# Patient Record
Sex: Male | Born: 1970 | Race: Black or African American | Hispanic: No | Smoking: Current every day smoker
Health system: Southern US, Community
[De-identification: ages and names within clinical notes are randomized; demographics above are authoritative.]

## PROBLEM LIST (undated history)

## (undated) DIAGNOSIS — I10 Essential (primary) hypertension: Secondary | ICD-10-CM

---

## 1997-04-26 DIAGNOSIS — I10 Essential (primary) hypertension: Secondary | ICD-10-CM

## 2004-08-05 ENCOUNTER — Ambulatory Visit: Payer: Self-pay | Admitting: Family Medicine

## 2004-08-12 ENCOUNTER — Ambulatory Visit: Payer: Self-pay | Admitting: *Deleted

## 2004-10-23 ENCOUNTER — Ambulatory Visit: Payer: Self-pay | Admitting: Family Medicine

## 2005-03-09 ENCOUNTER — Emergency Department (HOSPITAL_COMMUNITY): Admission: EM | Admit: 2005-03-09 | Discharge: 2005-03-09 | Payer: Self-pay | Admitting: Emergency Medicine

## 2008-01-19 ENCOUNTER — Ambulatory Visit: Payer: Self-pay | Admitting: Nurse Practitioner

## 2008-01-20 ENCOUNTER — Encounter (INDEPENDENT_AMBULATORY_CARE_PROVIDER_SITE_OTHER): Payer: Self-pay | Admitting: Nurse Practitioner

## 2008-01-20 LAB — CONVERTED CEMR LAB
AST: 30 units/L (ref 0–37)
Alkaline Phosphatase: 64 units/L (ref 39–117)
BUN: 23 mg/dL (ref 6–23)
CO2: 27 meq/L (ref 19–32)
Cholesterol: 203 mg/dL — ABNORMAL HIGH (ref 0–200)
Eosinophils Absolute: 0.1 10*3/uL (ref 0.0–0.7)
Eosinophils Relative: 2 % (ref 0–5)
Glucose, Bld: 90 mg/dL (ref 70–99)
HCT: 46.3 % (ref 39.0–52.0)
HDL: 72 mg/dL (ref >39)
Hemoglobin: 14.7 g/dL (ref 13.0–17.0)
LDL Cholesterol: 115 mg/dL — ABNORMAL HIGH (ref 0–99)
Lymphocytes Relative: 26 % (ref 12–46)
Lymphs Abs: 1.7 10*3/uL (ref 0.7–4.0)
MCV: 81.1 fL (ref 78.0–100.0)
Microalb, Ur: 1.8 mg/dL (ref 0.00–1.89)
Monocytes Absolute: 0.4 10*3/uL (ref 0.1–1.0)
Monocytes Relative: 7 % (ref 3–12)
Platelets: 214 10*3/uL (ref 150–400)
RBC: 5.71 M/uL (ref 4.22–5.81)
Sodium: 142 meq/L (ref 135–145)
Total Bilirubin: 0.4 mg/dL (ref 0.3–1.2)
Total Protein: 7.4 g/dL (ref 6.0–8.3)
Triglycerides: 78 mg/dL (ref <150)
VLDL: 16 mg/dL (ref 0–40)
WBC: 6.6 10*3/uL (ref 4.0–10.5)

## 2008-01-22 ENCOUNTER — Encounter (INDEPENDENT_AMBULATORY_CARE_PROVIDER_SITE_OTHER): Payer: Self-pay | Admitting: Nurse Practitioner

## 2008-01-22 ENCOUNTER — Ambulatory Visit: Payer: Self-pay | Admitting: *Deleted

## 2008-02-02 ENCOUNTER — Ambulatory Visit: Payer: Self-pay | Admitting: Nurse Practitioner

## 2010-05-24 LAB — CONVERTED CEMR LAB
Bilirubin Urine: NEGATIVE
Blood in Urine, dipstick: NEGATIVE
Glucose, Urine, Semiquant: NEGATIVE
Ketones, urine, test strip: NEGATIVE
Protein, U semiquant: NEGATIVE
Urobilinogen, UA: 0.2
pH: 7

## 2015-04-07 ENCOUNTER — Encounter (HOSPITAL_COMMUNITY): Payer: Self-pay | Admitting: Family Medicine

## 2015-04-07 ENCOUNTER — Emergency Department (HOSPITAL_COMMUNITY)
Admission: EM | Admit: 2015-04-07 | Discharge: 2015-04-07 | Disposition: A | Discharge status: Home / Self Care | Payer: Self-pay | Attending: Emergency Medicine | Admitting: Emergency Medicine

## 2015-04-07 ENCOUNTER — Emergency Department (HOSPITAL_COMMUNITY): Payer: Self-pay

## 2015-04-07 DIAGNOSIS — Z79899 Other long term (current) drug therapy: Secondary | ICD-10-CM | POA: Insufficient documentation

## 2015-04-07 DIAGNOSIS — I10 Essential (primary) hypertension: Secondary | ICD-10-CM | POA: Insufficient documentation

## 2015-04-07 DIAGNOSIS — R51 Headache: Secondary | ICD-10-CM | POA: Insufficient documentation

## 2015-04-07 DIAGNOSIS — F17210 Nicotine dependence, cigarettes, uncomplicated: Secondary | ICD-10-CM | POA: Insufficient documentation

## 2015-04-07 DIAGNOSIS — Q613 Polycystic kidney, unspecified: Secondary | ICD-10-CM | POA: Insufficient documentation

## 2015-04-07 DIAGNOSIS — R6883 Chills (without fever): Secondary | ICD-10-CM | POA: Insufficient documentation

## 2015-04-07 HISTORY — DX: Essential (primary) hypertension: I10

## 2015-04-07 LAB — COMPREHENSIVE METABOLIC PANEL
ALT: 15 U/L — ABNORMAL LOW (ref 17–63)
ANION GAP: 10 (ref 5–15)
AST: 21 U/L (ref 15–41)
Albumin: 4.5 g/dL (ref 3.5–5.0)
Alkaline Phosphatase: 67 U/L (ref 38–126)
BUN: 14 mg/dL (ref 6–20)
CHLORIDE: 107 mmol/L (ref 101–111)
CO2: 22 mmol/L (ref 22–32)
Calcium: 9.6 mg/dL (ref 8.9–10.3)
Creatinine, Ser: 1.55 mg/dL — ABNORMAL HIGH (ref 0.61–1.24)
GFR, EST NON AFRICAN AMERICAN: 53 mL/min — AB (ref >60)
Glucose, Bld: 111 mg/dL — ABNORMAL HIGH (ref 65–99)
POTASSIUM: 4.1 mmol/L (ref 3.5–5.1)
Sodium: 139 mmol/L (ref 135–145)
TOTAL PROTEIN: 7.1 g/dL (ref 6.5–8.1)
Total Bilirubin: 0.5 mg/dL (ref 0.3–1.2)

## 2015-04-07 LAB — URINALYSIS, ROUTINE W REFLEX MICROSCOPIC
Bilirubin Urine: NEGATIVE
Glucose, UA: NEGATIVE mg/dL
Hgb urine dipstick: NEGATIVE
Ketones, ur: NEGATIVE mg/dL
LEUKOCYTES UA: NEGATIVE
NITRITE: NEGATIVE
Protein, ur: NEGATIVE mg/dL
SPECIFIC GRAVITY, URINE: 1.015 (ref 1.005–1.030)
pH: 6.5 (ref 5.0–8.0)

## 2015-04-07 LAB — CBC WITH DIFFERENTIAL/PLATELET
BASOS ABS: 0 10*3/uL (ref 0.0–0.1)
Basophils Relative: 0 %
EOS ABS: 0.5 10*3/uL (ref 0.0–0.7)
Eosinophils Relative: 6 %
HCT: 44.7 % (ref 39.0–52.0)
HEMOGLOBIN: 14.7 g/dL (ref 13.0–17.0)
LYMPHS ABS: 1.2 10*3/uL (ref 0.7–4.0)
LYMPHS PCT: 14 %
MCH: 27.1 pg (ref 26.0–34.0)
MCHC: 32.9 g/dL (ref 30.0–36.0)
MCV: 82.3 fL (ref 78.0–100.0)
Monocytes Absolute: 0.4 10*3/uL (ref 0.1–1.0)
Monocytes Relative: 5 %
NEUTROS PCT: 75 %
Neutro Abs: 6.2 10*3/uL (ref 1.7–7.7)
Platelets: 215 10*3/uL (ref 150–400)
RBC: 5.43 MIL/uL (ref 4.22–5.81)
RDW: 13.9 % (ref 11.5–15.5)
WBC: 8.4 10*3/uL (ref 4.0–10.5)

## 2015-04-07 MED ORDER — MORPHINE SULFATE (PF) 4 MG/ML IV SOLN
4.0000 mg | Freq: Once | INTRAVENOUS | Status: AC
  Administered 2015-04-07: 4 mg via INTRAVENOUS
  Filled 2015-04-07: qty 1

## 2015-04-07 MED ORDER — LISINOPRIL 20 MG PO TABS
20.0000 mg | ORAL_TABLET | Freq: Once | ORAL | Status: AC
  Administered 2015-04-07: 20 mg via ORAL
  Filled 2015-04-07: qty 1

## 2015-04-07 MED ORDER — HYDROCHLOROTHIAZIDE 25 MG PO TABS
25.0000 mg | ORAL_TABLET | Freq: Once | ORAL | Status: AC
  Administered 2015-04-07: 25 mg via ORAL
  Filled 2015-04-07: qty 1

## 2015-04-07 MED ORDER — HYDROCODONE-ACETAMINOPHEN 5-325 MG PO TABS: 1.0000 | ORAL_TABLET | Freq: Four times a day (QID) | ORAL | Status: DC | PRN

## 2015-04-07 MED ORDER — AMLODIPINE BESYLATE 5 MG PO TABS
10.0000 mg | ORAL_TABLET | Freq: Once | ORAL | Status: AC
  Administered 2015-04-07: 10 mg via ORAL
  Filled 2015-04-07: qty 2

## 2015-04-07 NOTE — ED Provider Notes (Signed)
CSN: [id number]     Arrival date & time 04/07/15  [id number] History   First MD Initiated Contact with Patient 04/07/15 [phone]     Chief Complaint  Patient presents with  . Back Pain    HPI  Joseph Randall is a 44 year old with PMH of HTN who presents with left sided back and flank pain. The pain woke him from sleep Saturday morning, described as a throbbing sensation that worsens when he tries to sit down or stand up. He has associated headache and chills. He was concerned about a kidney problem and has been drinking more fluids during this time, which has provided some relief. He has also used [employer] which have helped some with his pain and headache. He denies any fever, N/V/D/C, dysuria, numbness/tingling, diaphoresis, chest pain, or difficulty breathing. He denies any history of pyelonephritis or renal stones. He smokes about a 0.5 PPD and reports alcohol use on the weekends. He denies any recreational drug use.  Past Medical History  Diagnosis Date  . Hypertension    History reviewed. No pertinent past surgical history. History reviewed. No pertinent family history. Social History  Substance Use Topics  . Smoking status: Current Every Day Smoker -- 0.50 packs/day    Types: Cigarettes  . Smokeless tobacco: None  . Alcohol Use: Yes     Comment: occ    Review of Systems  Constitutional: Positive for chills. Negative for fever, diaphoresis and fatigue.  Respiratory: Negative for shortness of breath.   Cardiovascular: Negative for chest pain and palpitations.  Gastrointestinal: Negative for nausea, vomiting, diarrhea, constipation and blood in stool.  Genitourinary: Positive for frequency and flank pain. Negative for dysuria and hematuria.  Musculoskeletal: Positive for back pain.  Neurological: Positive for headaches. Negative for numbness.      Allergies  Review of patient's allergies indicates no known allergies.  Home Medications   Prior to Admission medications    Medication Sig Start Date End Date Taking? Authorizing Provider  amLODipine (NORVASC) 10 MG tablet Take 10 mg by mouth daily.   Yes Historical Provider, MD  lisinopril-hydrochlorothiazide (PRINZIDE,ZESTORETIC) 20-25 MG tablet Take 1 tablet by mouth daily.   Yes Historical Provider, MD  HYDROcodone-acetaminophen (NORCO/VICODIN) 5-325 MG tablet Take 1 tablet by mouth every 6 (six) hours as needed for moderate pain. 04/07/15   Darreld McleanVishal Patel, MD   BP 187/121 mmHg  Pulse 72  Temp(Src) 98.5 F (36.9 C) (Oral)  Resp 16  SpO2 100% Physical Exam  Constitutional: He is oriented to person, place, and time. He appears well-developed and well-nourished. No distress.  HENT:  Head: Normocephalic and atraumatic.  Cardiovascular: Normal rate and regular rhythm.   No murmur heard. Pulmonary/Chest: Effort normal. No respiratory distress. He has no wheezes. He has no rales.  Abdominal: Soft. Bowel sounds are normal. There is no tenderness. There is no guarding.  There is Left sided CVA tenderness  Musculoskeletal:  Pain with flexion of hips bilaterally, more pronounced on left, decreased ROM due to pain.  Neurological: He is alert and oriented to person, place, and time.    ED Course  Procedures (including critical care time) Labs Review Labs Reviewed  COMPREHENSIVE METABOLIC PANEL - Abnormal; Notable for the following:    Glucose, Bld 111 (*)    Creatinine, Ser 1.55 (*)    ALT 15 (*)    GFR calc non Af Amer 53 (*)    All other components within normal limits  CBC WITH DIFFERENTIAL/PLATELET  URINALYSIS,  ROUTINE W REFLEX MICROSCOPIC (NOT AT [facility])    Imaging Review Ct Renal Stone Study  04/07/2015  CLINICAL DATA:  Left flank pain. EXAM: CT ABDOMEN AND PELVIS WITHOUT CONTRAST TECHNIQUE: Multidetector CT imaging of the abdomen and pelvis was performed following the standard protocol without IV contrast. COMPARISON:  None. FINDINGS: Lower chest: Lung bases are clear without infiltrate or effusion.  Heart size normal. Hepatobiliary: Multiple hepatic cysts are present measuring under 2 cm in diameter. No mass lesion. Gallbladder and bile ducts normal. Pancreas: Negative Spleen: Negative Adrenals/Urinary Tract: Numerous renal cysts bilaterally. The kidneys are enlarged. There are nonobstructing small stones in the left lower pole. No renal obstruction. No ureteral or bladder calculus. Urinary bladder normal. Stomach/Bowel: Negative for bowel obstruction or bowel edema. Normal appendix. Vascular/Lymphatic: Normal aorta and IVC. Negative for lymphadenopathy. Reproductive: Prostate mildly enlarged. Other: Negative for ascites. Musculoskeletal: No significant skeletal abnormality. IMPRESSION: Extensive cystic change throughout both kidneys. The kidneys are enlarged. There are hepatic cysts. Findings are consistent with polycystic kidney disease. Nonobstructing small left lower pole renal calculi. No hydronephrosis and no ureteral stone. Electronically Signed   By: Marlan Palau M.D.   On: 04/07/2015 11:04   I have personally reviewed and evaluated these images and lab results as part of my medical decision-making.   EKG Interpretation None      MDM   Final diagnoses:  Polycystic kidney disease  Joseph Randall is a 44 year old with PMH of HTN who presents with 2 day history of left sided back and flank pain. Pain is limiting his mobility and has left sided CVA tenderness on percussion. He is afebrile with normal heart rate, but blood pressure elevated at 203/134 on arrival. Initial concerns are for renal stones or pyelonephritis.  Will get CT Renal Stone Study to evaluate as well as CBC, CMP, and UA.  CBC and UA return normal, CMP shows elevated creatinine at 1.55. CT imaging reveals bilaterally enlarged kidneys with cystic changes throughout. Multiple hepatic cysts are also seen. Findings consistent with polycystic kidney disease. Patient is unaware of any renal disease in immediate family members.  A non-obstructing small left lower pole renal calculi is also seen on imaging, without hydronephrosis or ureteral stone.  Patient given home doses of BP meds, Lisinopril, Amlodipine and HCTZ prior to discharge.  Patient advised to follow up with his PCP, Dr. Harvest Dark for tight blood pressure control and further management. He is given short term Vicodin for pain management and advised to use ice/heat pads as well as continued fluid hydration.     Darreld Mclean, MD 04/07/15 1202  Arby Barrette, MD 04/08/15 [phone]

## 2015-04-07 NOTE — Discharge Instructions (Signed)
Please take your blood pressure medications as prescribed and follow up with your regular doctor in the next 1-2 weeks. You can try ice/heat packs for pain in addition to the pain medication we are prescribing. Continue to stay hydrated with fluid intake. Imaging showed that you have large kidneys and likely a condition called Polycystic Kidney Disease.   Polycystic Kidney Disease, Adult Polycystic kidney disease is a disease in which the kidneys grow many small fluid-filled cysts. These cysts squeeze healthy kidney tissue, hurting the function of the kidneys. Polycystic kidney disease is present at birth and often causes progressive loss of kidney function and high blood pressure (hypertension). In milder forms of the disease, kidney function may be adequate throughout life.  CAUSES  The condition is usually inherited from parents. SIGNS AND SYMPTOMS  Hypertension.   Not enough healthy red blood cells to carry adequate oxygen to your tissues (anemia).   Pain in middle and side of the back below ribs (flank pain). This can happen if a cyst is bleeding.   Blood in the urine.   Kidney failure.   Kidney stones.   Increased urination at night.   Liver disease and liver cysts.  DIAGNOSIS  Your health care provider will ask you about your history and symptoms and perform a physical exam. Tests may be done to diagnose the condition. They may include an ultrasound, a CT scan, or an MRI scan. Sometimes chromosome studies are done to see if you have the genes to have polycystic kidneys.  TREATMENT   There is no treatment at this time to keep cysts from forming or getting larger.  Cysts may need to be drained with a needle if they are large, putting pressure on other organs, and further destroying kidney tissue. This may require surgery.  Hypertension may be treated with medicines. Treating hypertension slows down further damage to the kidneys.  If kidney failure occurs, dialysis or  transplantation is used to treat the disease. HOME CARE INSTRUCTIONS You should follow up with a kidney specialist (nephrologist) so that your kidney function is monitored. SEEK MEDICAL CARE IF:  You have severe pain in the flank area.  You have blood in your urine.   This information is not intended to replace advice given to you by your health care provider. Make sure you discuss any questions you have with your health care provider.   Document Released: 01/05/2001 Document Revised: 08/27/2014 Document Reviewed: 09/14/2012 Elsevier Interactive Patient Education [employer]2016 Elsevier Inc.

## 2015-04-07 NOTE — ED Notes (Signed)
Pt presents from home via POV with c/o left lower back pain x2 days.  Denies hx renal issues, but is concerned for kidney stone/infection today.  Also reports does a lot of heavy lifting at work.  He says he has been drinking a lot of water over the last two days and that has helped his pain. Pt ambulatory to treatment room, in NAD.

## 2015-04-07 NOTE — ED Notes (Addendum)
Pt comfortable with discharge and follow up instructions. Pt declines wheelchair, escorted to waiting area by this RN. Prescriptions x1. 

## 2015-06-11 ENCOUNTER — Emergency Department (HOSPITAL_COMMUNITY)
Admission: EM | Admit: 2015-06-11 | Discharge: 2015-06-11 | Disposition: A | Discharge status: Home / Self Care | Payer: Self-pay | Attending: Emergency Medicine | Admitting: Emergency Medicine

## 2015-06-11 ENCOUNTER — Encounter (HOSPITAL_COMMUNITY): Payer: Self-pay | Admitting: Vascular Surgery

## 2015-06-11 DIAGNOSIS — M545 Low back pain, unspecified: Secondary | ICD-10-CM

## 2015-06-11 DIAGNOSIS — R509 Fever, unspecified: Secondary | ICD-10-CM | POA: Insufficient documentation

## 2015-06-11 DIAGNOSIS — I159 Secondary hypertension, unspecified: Secondary | ICD-10-CM | POA: Insufficient documentation

## 2015-06-11 DIAGNOSIS — I10 Essential (primary) hypertension: Secondary | ICD-10-CM | POA: Insufficient documentation

## 2015-06-11 DIAGNOSIS — F17210 Nicotine dependence, cigarettes, uncomplicated: Secondary | ICD-10-CM | POA: Insufficient documentation

## 2015-06-11 LAB — I-STAT CHEM 8, ED
BUN: 18 mg/dL (ref 6–20)
CHLORIDE: 101 mmol/L (ref 101–111)
Calcium, Ion: 1.16 mmol/L (ref 1.12–1.23)
Creatinine, Ser: 1.5 mg/dL — ABNORMAL HIGH (ref 0.61–1.24)
Glucose, Bld: 82 mg/dL (ref 65–99)
HEMATOCRIT: 50 % (ref 39.0–52.0)
Hemoglobin: 17 g/dL (ref 13.0–17.0)
POTASSIUM: 3.8 mmol/L (ref 3.5–5.1)
SODIUM: 142 mmol/L (ref 135–145)
TCO2: 27 mmol/L (ref 0–100)

## 2015-06-11 MED ORDER — HYDROCODONE-ACETAMINOPHEN 5-325 MG PO TABS
2.0000 | ORAL_TABLET | Freq: Once | ORAL | Status: AC
  Administered 2015-06-11: 2 via ORAL
  Filled 2015-06-11: qty 2

## 2015-06-11 MED ORDER — LISINOPRIL 10 MG PO TABS
10.0000 mg | ORAL_TABLET | Freq: Once | ORAL | Status: AC
  Administered 2015-06-11: 10 mg via ORAL
  Filled 2015-06-11: qty 1

## 2015-06-11 MED ORDER — AMLODIPINE BESYLATE 10 MG PO TABS: 10.0000 mg | ORAL_TABLET | Freq: Every day | ORAL | Status: AC

## 2015-06-11 MED ORDER — AMLODIPINE BESYLATE 5 MG PO TABS
10.0000 mg | ORAL_TABLET | Freq: Once | ORAL | Status: AC
  Administered 2015-06-11: 10 mg via ORAL
  Filled 2015-06-11: qty 2

## 2015-06-11 MED ORDER — LISINOPRIL-HYDROCHLOROTHIAZIDE 20-25 MG PO TABS: 1.0000 | ORAL_TABLET | Freq: Every day | ORAL | Status: AC

## 2015-06-11 MED ORDER — HYDROCODONE-ACETAMINOPHEN 5-325 MG PO TABS: 2.0000 | ORAL_TABLET | ORAL | Status: AC | PRN

## 2015-06-11 NOTE — ED Provider Notes (Signed)
CSN: [id number]     Arrival date & time 06/11/15  2006 History  By signing my name below, I, Linna Darner, attest that this documentation has been prepared under the direction and in the presence of non-physician practitioner, Haynes Dage, PA-C. Electronically Signed: Linna Darner, Scribe. 06/11/2015. 9:15 PM.   Chief Complaint  Patient presents with  . Back Pain    The history is provided by the patient. No language interpreter was used.    HPI Comments: Randie Tallarico is a 45 y.o. male with h/o HTN who presents to the Emergency Department complaining of gradual onset, intermittent, lower back pain and stiffness for a few years. He states that his pain has worsened significantly over the past few months. He works at [employer] and stands throughout his shift; he notes this could be the reason for his back pain. He endorses that his back pain is at its worst when he raises his feet or is in a "low position." He reports that he takes Advil for his back pain with no relief. Pt endorses pain with palpation to his lower left side. Pt also notes intermittent fever and chills as well; he states that they come and go with weather changes. Pt is out of his BP medication. He denies h/o cancer or recent prednisone use. He also denies lower extremity weakness/numbness, bowel/bladder incontinence or retention, or any other associated symptoms at this time.     Past Medical History  Diagnosis Date  . Hypertension    History reviewed. No pertinent past surgical history. No family history on file. Social History  Substance Use Topics  . Smoking status: Current Every Day Smoker -- 0.50 packs/day    Types: Cigarettes  . Smokeless tobacco: None  . Alcohol Use: Yes     Comment: occ    Review of Systems  Constitutional: Positive for fever (intermittent) and chills (intermittent).  Genitourinary:       No bladder/bowel incontinence or retention  Musculoskeletal: Positive for back pain  (lower).  Neurological: Negative for weakness and numbness.  All other systems reviewed and are negative.     Allergies  Review of patient's allergies indicates no known allergies.  Home Medications   Prior to Admission medications   Medication Sig Start Date End Date Taking? Authorizing Provider  amLODipine (NORVASC) 10 MG tablet Take 1 tablet (10 mg total) by mouth daily. 06/11/15   Gust Eugene Patel-Mills, PA-C  HYDROcodone-acetaminophen (NORCO/VICODIN) 5-325 MG tablet Take 2 tablets by mouth every 4 (four) hours as needed. 06/11/15   Darleen Moffitt Patel-Mills, PA-C  lisinopril-hydrochlorothiazide (PRINZIDE,ZESTORETIC) 20-25 MG tablet Take 1 tablet by mouth daily. 06/11/15   Lenea Bywater Patel-Mills, PA-C   BP 208/129 mmHg  Pulse 74  Temp(Src) 98.3 F (36.8 C) (Oral)  Resp 14  SpO2 99% Physical Exam  Constitutional: He is oriented to person, place, and time. He appears well-developed and well-nourished. No distress.  HENT:  Head: Normocephalic and atraumatic.  Eyes: Conjunctivae and EOM are normal.  Neck: Neck supple. No tracheal deviation present.  Cardiovascular: Normal rate, regular rhythm and normal heart sounds.   No murmur heard. Pulmonary/Chest: Effort normal and breath sounds normal. No respiratory distress. He has no wheezes. He has no rales.  Abdominal:  No abdominal tenderness to palpation.  Musculoskeletal: Normal range of motion.  Left flank tenderness with [employer] punch. No rash or ecchymosis. No midline lumbar vertebral tenderness or thoracic tenderness.  No lower extremity weakness or numbness. No saddle anesthesia. Ambulatory with steady gait.  Neurological:  He is alert and oriented to person, place, and time.  Skin: Skin is warm and dry.  Psychiatric: He has a normal mood and affect. His behavior is normal.  Nursing note and vitals reviewed.   ED Course  Procedures (including critical care time)  DIAGNOSTIC STUDIES: Oxygen Saturation is 98% on RA, normal by my  interpretation.    COORDINATION OF CARE: 9:16 PM Will administer Norvasc, Zestril, and Vicodin. Will order blood work. Discussed treatment plan with pt at bedside and pt agreed to plan.   Labs Review Labs Reviewed  I-STAT CHEM 8, ED - Abnormal; Notable for the following:    Creatinine, Ser 1.50 (*)    All other components within normal limits    Imaging Review No results found. I have personally reviewed and evaluated these lab results as part of my medical decision-making.   EKG Interpretation None      MDM   Final diagnoses:  Secondary hypertension, unspecified  Left-sided low back pain without sciatica   Patient presents for left-sided back pain. Reports having this for several years. Worse with movement of the opposite leg (right).  According to note on 04/07/2015 Patient advised to follow up with his PCP, Dr. Harvest Dark for tight blood pressure control and further management. He is given short term Vicodin for pain management and advised to use ice/heat pads as well as continued fluid hydration. Patient given home doses of BP meds, Lisinopril, Amlodipine and HCTZ prior to discharge.  No signs of cauda equina. No focal neuro deficit. Most likely musculoskeletal pain.   Patient states he has not been taking his meds because he ran out. His blood pressure today is 205/117. No hypertensive urgency or emergency. Will evaluate kidney function and refill prescriptions for blood pressure. I thoroughly discussed importance of blood pressure control and follow-up with primary doctor.  Filed Vitals:   06/11/15 2038 06/11/15 2241  BP: 205/117 208/129  Pulse: 74 74  Temp: 98.3 F (36.8 C)   Resp: 16 14   Medications  amLODipine (NORVASC) tablet 10 mg (10 mg Oral Given 06/11/15 2200)  lisinopril (PRINIVIL,ZESTRIL) tablet 10 mg (10 mg Oral Given 06/11/15 2159)  HYDROcodone-acetaminophen (NORCO/VICODIN) 5-325 MG per tablet 2 tablet (2 tablets Oral Given 06/11/15 2159)   I  personally performed the services described in this documentation, which was scribed in my presence. The recorded information has been reviewed and is accurate.      Catha Gosselin, PA-C 06/11/15 2250  Donnetta Hutching, MD 06/11/15 [phone]

## 2015-06-11 NOTE — ED Notes (Signed)
Per pt, he has had back pain "off and on for a while". Per pt, pain gets worse with weather changes.

## 2015-06-11 NOTE — Discharge Instructions (Signed)
Hypertension Follow up with your doctor regarding her high blood pressure. Take medications as prescribed. Hypertension is another name for high blood pressure. High blood pressure forces your heart to work harder to pump blood. A blood pressure reading has two numbers, which includes a higher number over a lower number (example: 110/72). HOME CARE   Have your blood pressure rechecked by your doctor.  Only take medicine as told by your doctor. Follow the directions carefully. The medicine does not work as well if you skip doses. Skipping doses also puts you at risk for problems.  Do not smoke.  Monitor your blood pressure at home as told by your doctor. GET HELP IF:  You think you are having a reaction to the medicine you are taking.  You have repeat headaches or feel dizzy.  You have puffiness (swelling) in your ankles.  You have trouble with your vision. GET HELP RIGHT AWAY IF:   You get a very bad headache and are confused.  You feel weak, numb, or faint.  You get chest or belly (abdominal) pain.  You throw up (vomit).  You cannot breathe very well. MAKE SURE YOU:   Understand these instructions.  Will watch your condition.  Will get help right away if you are not doing well or get worse.   This information is not intended to replace advice given to you by your health care provider. Make sure you discuss any questions you have with your health care provider.   Document Released: 09/29/2007 Document Revised: 04/17/2013 Document Reviewed: 02/02/2013 Elsevier Interactive Patient Education 2016 Elsevier Inc.  Back Pain, Adult Back pain is very common. The pain often gets better over time. The cause of back pain is usually not dangerous. Most people can learn to manage their back pain on their own.  HOME CARE  Watch your back pain for any changes. The following actions may help to lessen any pain you are feeling:  Stay active. Start with short walks on flat ground if  you can. Try to walk farther each day.  Exercise regularly as told by your doctor. Exercise helps your back heal faster. It also helps avoid future injury by keeping your muscles strong and flexible.  Do not sit, drive, or stand in one place for more than 30 minutes.  Do not stay in bed. Resting more than 1-2 days can slow down your recovery.  Be careful when you bend or lift an object. Use good form when lifting:  Bend at your knees.  Keep the object close to your body.  Do not twist.  Sleep on a firm mattress. Lie on your side, and bend your knees. If you lie on your back, put a pillow under your knees.  Take medicines only as told by your doctor.  Put ice on the injured area.  Put ice in a plastic bag.  Place a towel between your skin and the bag.  Leave the ice on for 20 minutes, 2-3 times a day for the first 2-3 days. After that, you can switch between ice and heat packs.  Avoid feeling anxious or stressed. Find good ways to deal with stress, such as exercise.  Maintain a healthy weight. Extra weight puts stress on your back. GET HELP IF:   You have pain that does not go away with rest or medicine.  You have worsening pain that goes down into your legs or buttocks.  You have pain that does not get better in one week.  You have pain at night.  You lose weight.  You have a fever or chills. GET HELP RIGHT AWAY IF:   You cannot control when you poop (bowel movement) or pee (urinate).  Your arms or legs feel weak.  Your arms or legs lose feeling (numbness).  You feel sick to your stomach (nauseous) or throw up (vomit).  You have belly (abdominal) pain.  You feel like you may pass out (faint).   This information is not intended to replace advice given to you by your health care provider. Make sure you discuss any questions you have with your health care provider.   Document Released: 09/29/2007 Document Revised: 05/03/2014 Document Reviewed:  08/14/2013 Elsevier Interactive Patient Education [employer]

## 2015-06-11 NOTE — ED Notes (Signed)
Pt reports to the ED for eval of low back pain. Pt reports he was seen here for the same and he was given pain medication but he ran out. Pt reports intermittent bilateral toe numbness. Denies any other numbness, tingling, paralysis, or bowel or bladder changes. Pt A&OX4, resp e/u, and skin warm and dry. Denies any known injury

## 2015-12-18 ENCOUNTER — Encounter (HOSPITAL_COMMUNITY): Payer: Self-pay | Admitting: *Deleted

## 2015-12-18 ENCOUNTER — Emergency Department (HOSPITAL_COMMUNITY)
Admission: EM | Admit: 2015-12-18 | Discharge: 2015-12-18 | Disposition: A | Discharge status: Home / Self Care | Payer: Self-pay | Attending: Emergency Medicine | Admitting: Emergency Medicine

## 2015-12-18 DIAGNOSIS — Y939 Activity, unspecified: Secondary | ICD-10-CM | POA: Insufficient documentation

## 2015-12-18 DIAGNOSIS — M79604 Pain in right leg: Secondary | ICD-10-CM

## 2015-12-18 DIAGNOSIS — Y929 Unspecified place or not applicable: Secondary | ICD-10-CM | POA: Insufficient documentation

## 2015-12-18 DIAGNOSIS — M79661 Pain in right lower leg: Secondary | ICD-10-CM | POA: Insufficient documentation

## 2015-12-18 DIAGNOSIS — Z79899 Other long term (current) drug therapy: Secondary | ICD-10-CM | POA: Insufficient documentation

## 2015-12-18 DIAGNOSIS — I10 Essential (primary) hypertension: Secondary | ICD-10-CM | POA: Insufficient documentation

## 2015-12-18 DIAGNOSIS — F17210 Nicotine dependence, cigarettes, uncomplicated: Secondary | ICD-10-CM | POA: Insufficient documentation

## 2015-12-18 DIAGNOSIS — Y999 Unspecified external cause status: Secondary | ICD-10-CM | POA: Insufficient documentation

## 2015-12-18 DIAGNOSIS — W228XXA Striking against or struck by other objects, initial encounter: Secondary | ICD-10-CM | POA: Insufficient documentation

## 2015-12-18 NOTE — ED Triage Notes (Signed)
PT  Presents today with redness and swelling to RT lolwer ,anterior leg. Date and cause unknown to PT.

## 2015-12-18 NOTE — ED Triage Notes (Signed)
PT refuses x- ray and wants to go home.

## 2015-12-18 NOTE — ED Provider Notes (Signed)
[facility]EMERGENCY DEPT Provider Note   CSN: [id number] Arrival date & time: 12/18/15  1111  By signing my name [id number], I, Joseph Randall, attest that this documentation has been prepared under the direction and in the presence of Joseph Randall, [place]-C. Electronically signed by: Joseph Randall, ED Scribe. 12/18/15. 12:42 PM.   History   Chief Complaint Chief Complaint  Patient presents with  . Leg Injury    The history is provided by the patient. No language interpreter was used.   HPI Comments:  Joseph Randall is a 45 y.o. male with a history of HTN who presents to the Emergency Department complaining of constant right lower leg pain, which started a couple days ago. Pt reports that he believes he hit his leg on the edge of the bed. Associated symptoms include redness and swelling to affected area. No modifying factors reported. Denies fever or chills. No history of DMII.   Past Medical History:  Diagnosis Date  . Hypertension     Patient Active Problem List   Diagnosis Date Noted  . HYPERTENSION, BENIGN ESSENTIAL 04/26/1997    No past surgical history on file.     Home Medications    Prior to Admission medications   Medication Sig Start Date End Date Taking? Authorizing Provider  amLODipine (NORVASC) 10 MG tablet Take 1 tablet (10 mg total) by mouth daily. 06/11/15   Joseph Patel-Mills, PA-C  HYDROcodone-acetaminophen (NORCO/VICODIN) 5-325 MG tablet Take 2 tablets by mouth every 4 (four) hours as needed. 06/11/15   Joseph Patel-Mills, PA-C  lisinopril-hydrochlorothiazide (PRINZIDE,ZESTORETIC) 20-25 MG tablet Take 1 tablet by mouth daily. 06/11/15   Catha GosselinHanna Patel-Mills, PA-C    Family History No family history on file.  Social History Social History  Substance Use Topics  . Smoking status: Current Every Day Smoker    Packs/day: 0.50    Types: Cigarettes  . Smokeless tobacco: Not on file  . Alcohol use Yes     Comment: occ     Allergies   Review of patient's allergies  indicates no known allergies.   Review of Systems Review of Systems  Constitutional: Negative for chills and fever.  Musculoskeletal: Positive for myalgias.  Skin: Positive for color change and wound.  All other systems reviewed and are negative.    Physical Exam Updated Vital Signs Ht 6\' 1"  (1.854 m)   Wt 205 lb (93 kg)   BMI 27.05 kg/m   Physical Exam  Constitutional: He is oriented to person, place, and time. He appears well-developed and well-nourished. No distress.  HENT:  Head: Normocephalic and atraumatic.  Eyes: Conjunctivae are normal. Right eye exhibits no discharge. Left eye exhibits no discharge. No scleral icterus.  Cardiovascular: Normal rate.   Pulmonary/Chest: Effort normal.  Musculoskeletal: He exhibits edema.  Red swollen area to right anterior tibia. Edematous. No heat. Neurovascular and neurosensory are intact.   Neurological: He is alert and oriented to person, place, and time. Coordination normal.  Skin: Skin is warm and dry. No rash noted. He is not diaphoretic. No erythema. No pallor.  Psychiatric: He has a normal mood and affect. His behavior is normal.  Nursing note and vitals reviewed.    ED Treatments / Results  DIAGNOSTIC STUDIES:    COORDINATION OF CARE:  12:28 PM Discussed treatment plan with pt at bedside and pt agreed to plan. Pt offered x-ray of right tibia, but he declined.  Labs (all labs ordered are listed, but only abnormal results are displayed) Labs Reviewed - No data  to display  EKG  EKG Interpretation None       Radiology No results found.  Procedures Procedures (including critical care time)  Medications Ordered in ED Medications - No data to display   Initial Impression / Assessment and Plan / ED Course  I have reviewed the triage vital signs and the nursing notes.  Pertinent labs & imaging results that were available during my care of the patient were reviewed by me and considered in my medical decision  making (see chart for details).  Clinical Course   Pt refused xray or evaluation.  Pt states he justed needs a note for his job that he came in  Final Clinical Impressions(s) / ED Diagnoses   Final diagnoses:  Right leg pain    New Prescriptions New Prescriptions   No medications on file   An After Visit Summary was printed and given to the patient.  I personally performed the services in this documentation, which was scribed in my presence.  The recorded information has been reviewed and considered.   Joseph PallKaren SofiaPAC.   Joseph SkinnerLeslie K LafayetteSofia, PA-C 12/18/15 1345    Joseph Raceravid Yelverton, MD 12/24/15 [phone]

## 2015-12-18 NOTE — ED Notes (Signed)
Declined W/C at D/C and was escorted to lobby by RN. 

## 2015-12-18 NOTE — Discharge Instructions (Signed)
You need further evaluation.   Return if you decide you wish to be evaluated.

## 2016-06-23 ENCOUNTER — Emergency Department (HOSPITAL_COMMUNITY)
Admission: EM | Admit: 2016-06-23 | Discharge: 2016-06-23 | Disposition: A | Discharge status: Home / Self Care | Payer: Self-pay | Attending: Dermatology | Admitting: Dermatology

## 2016-06-23 ENCOUNTER — Encounter (HOSPITAL_COMMUNITY): Payer: Self-pay | Admitting: Emergency Medicine

## 2016-06-23 DIAGNOSIS — M549 Dorsalgia, unspecified: Secondary | ICD-10-CM | POA: Insufficient documentation

## 2016-06-23 DIAGNOSIS — Z5321 Procedure and treatment not carried out due to patient leaving prior to being seen by health care provider: Secondary | ICD-10-CM | POA: Insufficient documentation

## 2016-06-23 LAB — URINALYSIS, ROUTINE W REFLEX MICROSCOPIC
Bilirubin Urine: NEGATIVE
Glucose, UA: NEGATIVE mg/dL
Ketones, ur: NEGATIVE mg/dL
NITRITE: NEGATIVE
PROTEIN: 30 mg/dL — AB
Specific Gravity, Urine: 1.013 (ref 1.005–1.030)
pH: 6 (ref 5.0–8.0)

## 2016-06-23 LAB — URINALYSIS, MICROSCOPIC (REFLEX)

## 2016-06-23 MED ORDER — OXYCODONE-ACETAMINOPHEN 5-325 MG PO TABS
1.0000 | ORAL_TABLET | ORAL | Status: DC | PRN
  Administered 2016-06-23: 1 via ORAL

## 2016-06-23 MED ORDER — OXYCODONE-ACETAMINOPHEN 5-325 MG PO TABS
ORAL_TABLET | ORAL | Status: AC
  Administered 2016-06-23: 1 via ORAL
  Filled 2016-06-23: qty 1

## 2016-06-23 NOTE — ED Triage Notes (Signed)
Pt sts right sided back and flank pain with hematuria starting this am

## 2016-06-23 NOTE — ED Notes (Signed)
Unable to locate patient in waiting room.

## 2016-06-23 NOTE — ED Notes (Signed)
Called several times in waiting area to reassess, no answer. Called phone numbers in patients chart but was unable to reach patient.

## 2016-10-15 IMAGING — CT CT RENAL STONE PROTOCOL
2 of 4 series · 16 of 46 positions shown, 18 images · non-contrast
Comparison: None.

CLINICAL DATA: Left flank pain.

EXAM:
CT ABDOMEN AND PELVIS WITHOUT CONTRAST
TECHNIQUE: Multidetector CT imaging of the abdomen and pelvis was performed
following the standard protocol without IV contrast.

[Series 2: renal stone 5mm · axial · 0.74mm/px · z∈[-491,-51]mm · 13 of 98 slices shown, 15 images]
[im 5/98  soft-tissue]
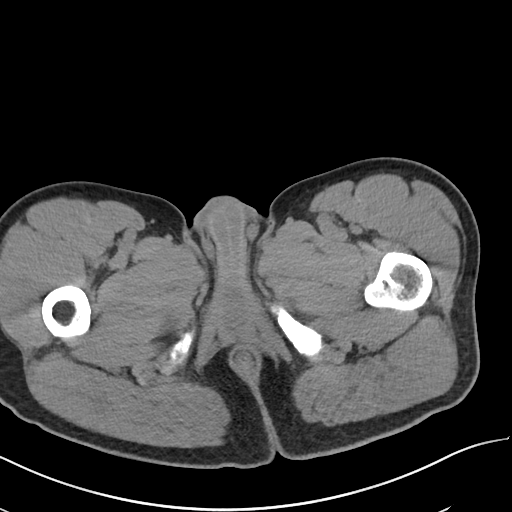
[im 5/98  bone]
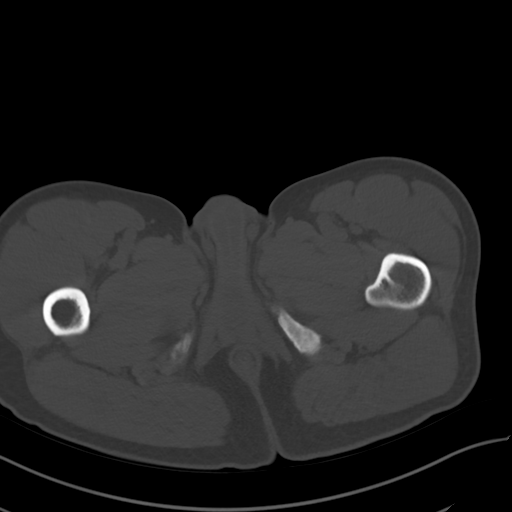
[im 13/98  soft-tissue]
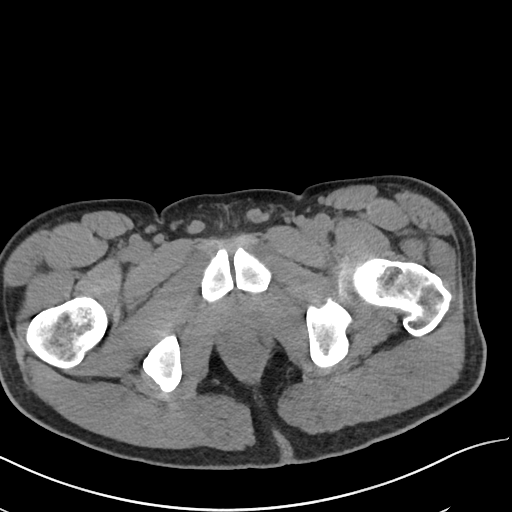
[im 21/98  soft-tissue]
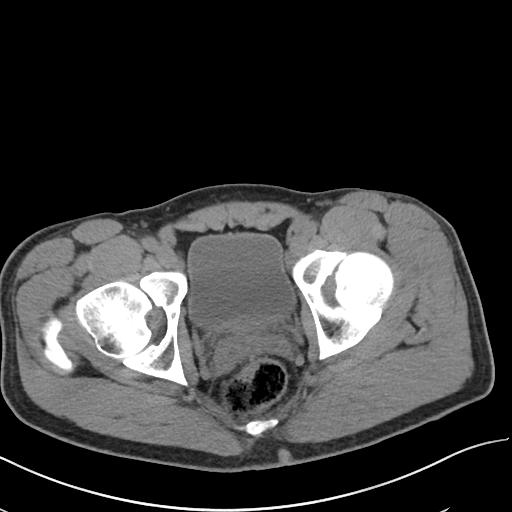
[im 29/98  soft-tissue]
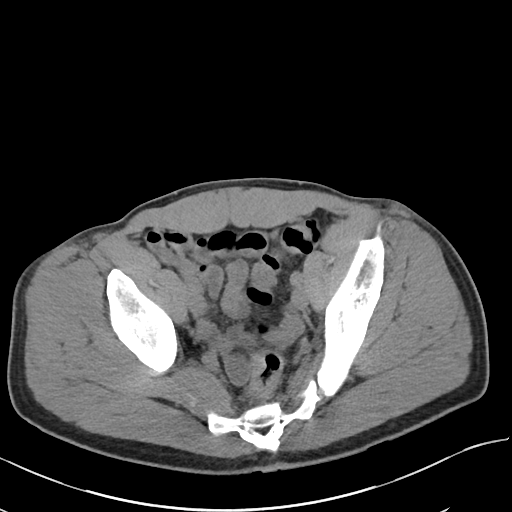
[im 33/98  soft-tissue]
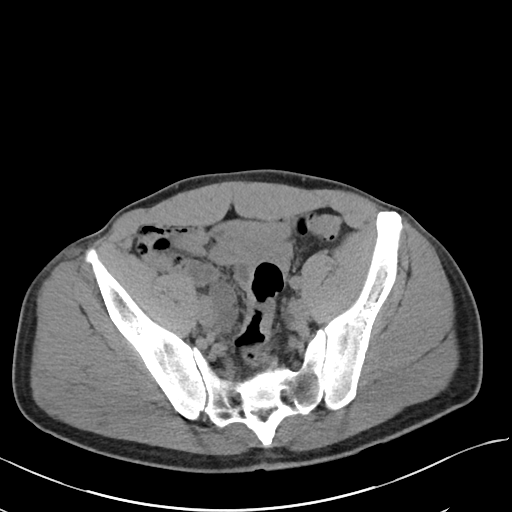
[im 41/98  soft-tissue]
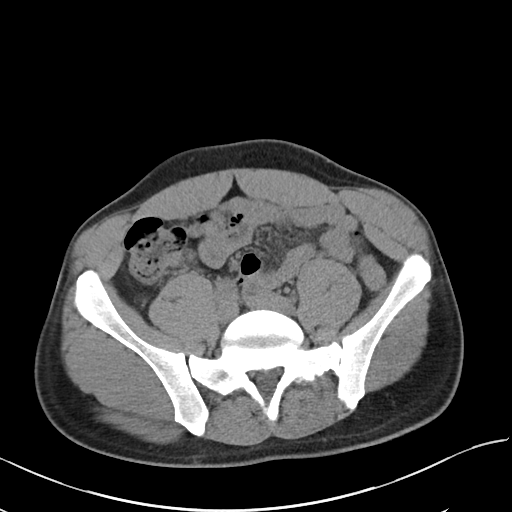
[im 49/98  soft-tissue]
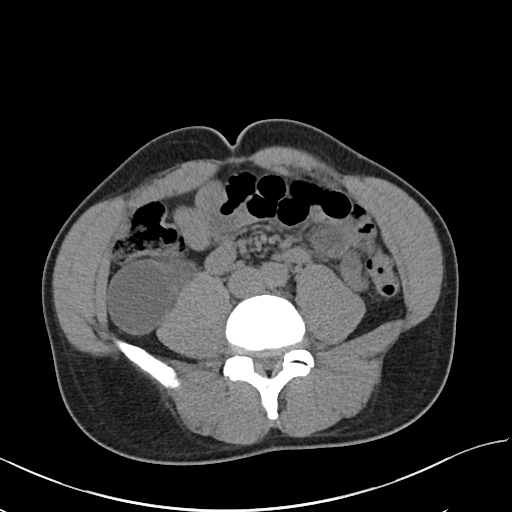
[im 57/98  soft-tissue]
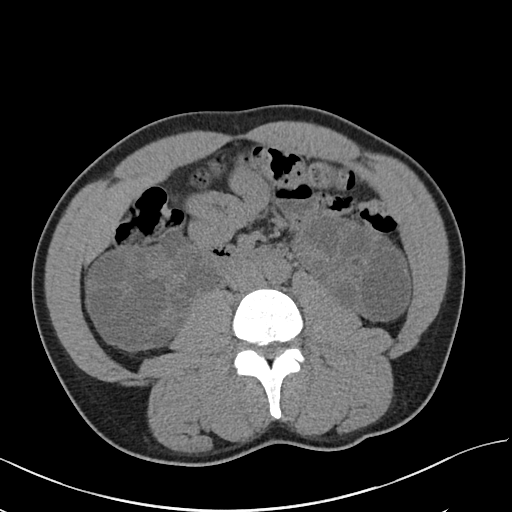
[im 65/98  soft-tissue]
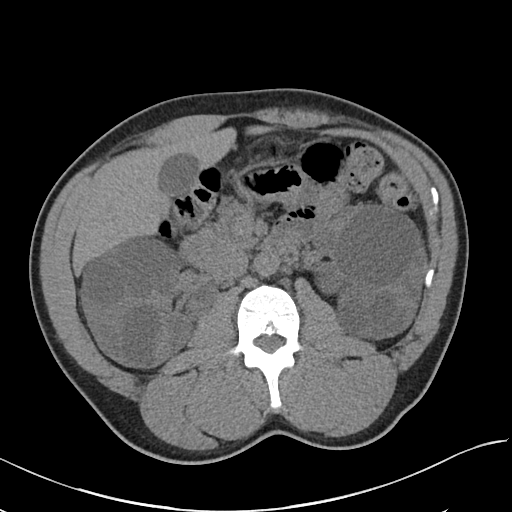
[im 65/98  bone]
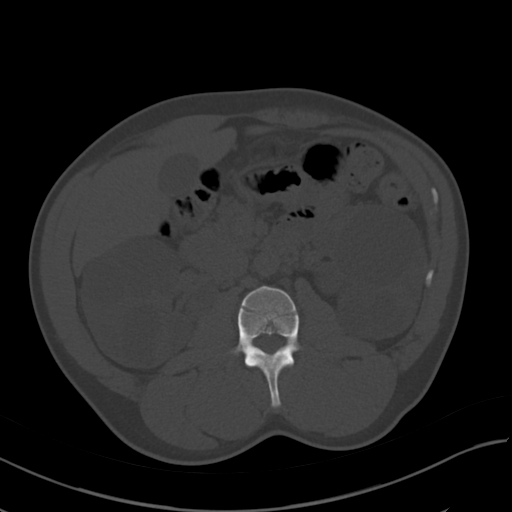
[im 69/98  soft-tissue]
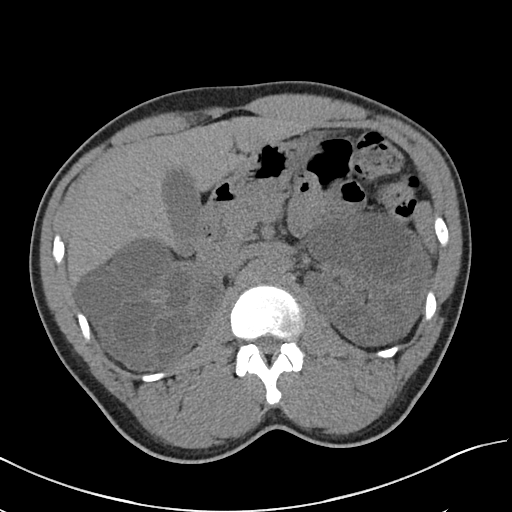
[im 77/98  soft-tissue]
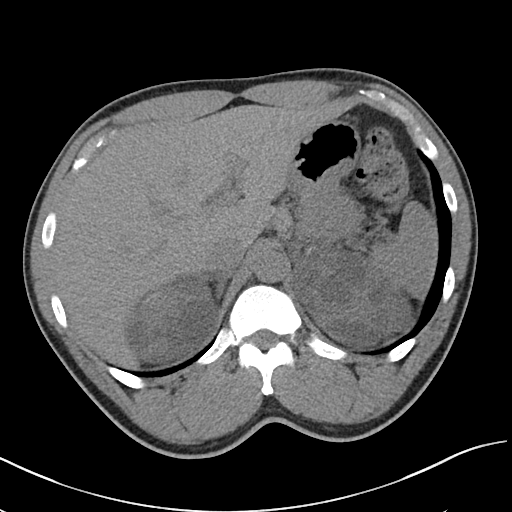
[im 85/98  soft-tissue]
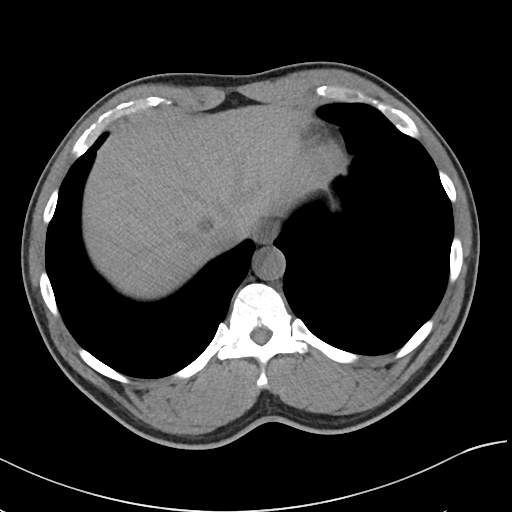
[im 93/98  soft-tissue]
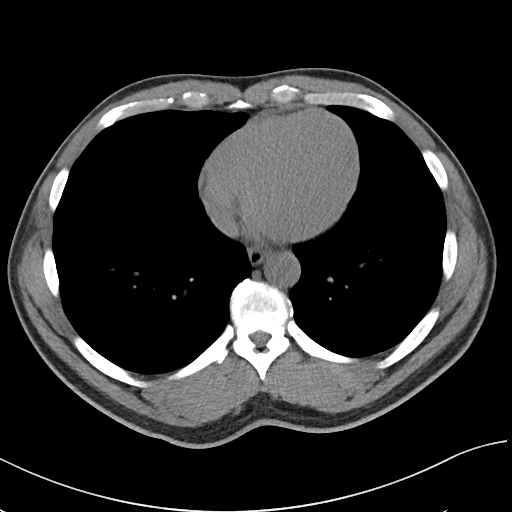

[Series 4: renal stone 3.0 cor · coronal · 0.75mm/px · 3 of 98 slices shown]
[im 33/98  soft-tissue]
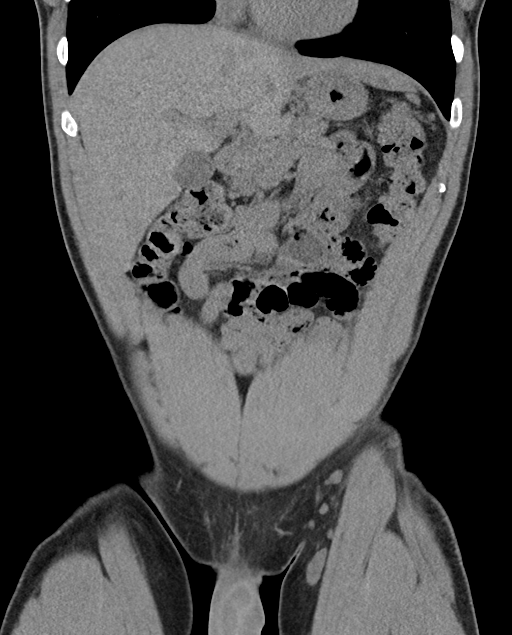
[im 44/98  soft-tissue]
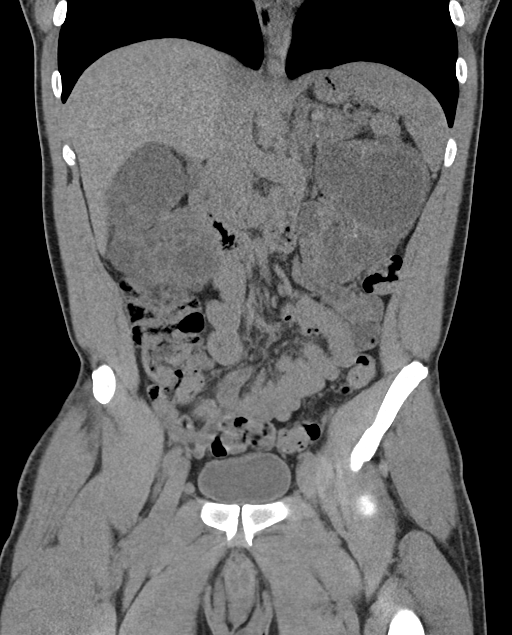
[im 54/98  soft-tissue]
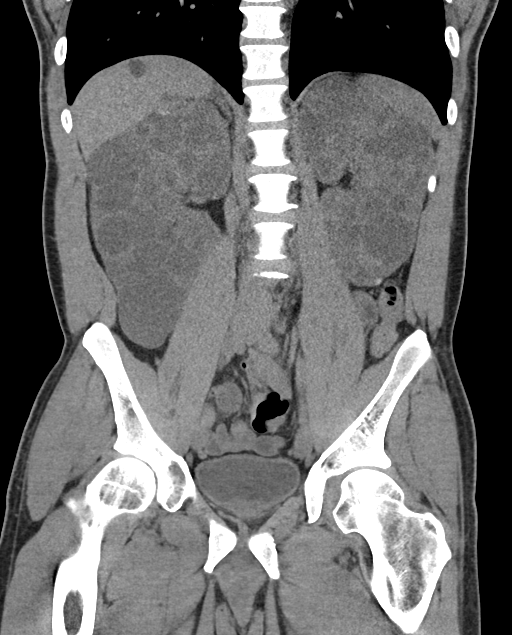

[16 of 46 positions shown; findings below may reference images not displayed]

FINDINGS: Lower chest: Lung bases are clear without infiltrate or effusion.
Heart size normal.

Hepatobiliary: Multiple hepatic cysts are present measuring under 2
cm in diameter. No mass lesion. Gallbladder and bile ducts normal.

Pancreas: Negative

Spleen: Negative

Adrenals/Urinary Tract: Numerous renal cysts bilaterally. The
kidneys are enlarged. There are nonobstructing small stones in the
left lower pole. No renal obstruction. No ureteral or bladder
calculus. Urinary bladder normal.

Stomach/Bowel: Negative for bowel obstruction or bowel edema. Normal
appendix.

Vascular/Lymphatic: Normal aorta and IVC. Negative for
lymphadenopathy.

Reproductive: Prostate mildly enlarged.

Other: Negative for ascites.

Musculoskeletal: No significant skeletal abnormality.
IMPRESSION: Extensive cystic change throughout both kidneys. The kidneys are
enlarged. There are hepatic cysts. Findings are consistent with
polycystic kidney disease.

Nonobstructing small left lower pole renal calculi. No
hydronephrosis and no ureteral stone.

## 2016-11-11 ENCOUNTER — Emergency Department (HOSPITAL_COMMUNITY)
Admission: EM | Admit: 2016-11-11 | Discharge: 2016-11-24 | Disposition: E | Payer: Self-pay | Attending: Emergency Medicine | Admitting: Emergency Medicine

## 2016-11-11 DIAGNOSIS — S21332A Puncture wound without foreign body of left front wall of thorax with penetration into thoracic cavity, initial encounter: Secondary | ICD-10-CM

## 2016-11-11 DIAGNOSIS — Y998 Other external cause status: Secondary | ICD-10-CM | POA: Insufficient documentation

## 2016-11-11 DIAGNOSIS — S21342A Puncture wound with foreign body of left front wall of thorax with penetration into thoracic cavity, initial encounter: Secondary | ICD-10-CM | POA: Insufficient documentation

## 2016-11-11 DIAGNOSIS — Y929 Unspecified place or not applicable: Secondary | ICD-10-CM | POA: Insufficient documentation

## 2016-11-11 DIAGNOSIS — Y939 Activity, unspecified: Secondary | ICD-10-CM | POA: Insufficient documentation

## 2016-11-11 DIAGNOSIS — W3400XA Accidental discharge from unspecified firearms or gun, initial encounter: Secondary | ICD-10-CM

## 2016-11-11 DIAGNOSIS — Y22XXXA Handgun discharge, undetermined intent, initial encounter: Secondary | ICD-10-CM | POA: Insufficient documentation

## 2016-11-11 LAB — PREPARE FRESH FROZEN PLASMA
UNIT DIVISION: 0
Unit division: 0

## 2016-11-11 LAB — BPAM FFP
BLOOD PRODUCT EXPIRATION DATE: 201807192359
BLOOD PRODUCT EXPIRATION DATE: 201808052359
ISSUE DATE / TIME: 201807192211
ISSUE DATE / TIME: 201807192211
UNIT TYPE AND RH: 600
UNIT TYPE AND RH: 6200

## 2016-11-11 MED ORDER — EPINEPHRINE PF 1 MG/10ML IJ SOSY
PREFILLED_SYRINGE | INTRAMUSCULAR | Status: AC | PRN
  Administered 2016-11-11 (×3): 1 mg via INTRAVENOUS

## 2016-11-11 NOTE — ED Notes (Signed)
CPR continued. 

## 2016-11-11 NOTE — ED Notes (Signed)
Patient time of death occurred at 2231. 
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
## 2016-11-11 NOTE — ED Notes (Signed)
[facility] rapid infuser initiated, NS
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
## 2016-11-11 NOTE — ED Notes (Signed)
[facility] unit #1 complete
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
## 2016-11-11 NOTE — ED Notes (Signed)
PR[facility] unit 1 per [facility] started
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
## 2016-11-11 NOTE — ED Notes (Signed)
Holding compressions; bedside ultrasound to assess cardiac activity-- no cardiac activity detected per Verdie MosherLiu, MD

## 2016-11-13 LAB — BPAM RBC
Blood Product Expiration Date: 201807252359
Blood Product Expiration Date: 201807252359
ISSUE DATE / TIME: 201807192210
ISSUE DATE / TIME: 201807192210
UNIT TYPE AND RH: 9500
Unit Type and Rh: 9500

## 2016-11-13 LAB — TYPE AND SCREEN
UNIT DIVISION: 0
UNIT DIVISION: 0
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
## 2016-11-24 DEATH — deceased
